# Patient Record
Sex: Male | Born: 1980 | Race: Black or African American | Hispanic: No | Marital: Married | Smoking: Never smoker
Health system: Southern US, Community
[De-identification: ages and names within clinical notes are randomized; demographics above are authoritative.]

## PROBLEM LIST (undated history)

## (undated) DIAGNOSIS — J45909 Unspecified asthma, uncomplicated: Secondary | ICD-10-CM

---

## 2009-05-01 ENCOUNTER — Emergency Department (HOSPITAL_COMMUNITY): Admission: EM | Admit: 2009-05-01 | Discharge: 2009-05-02 | Payer: Self-pay | Admitting: Emergency Medicine

## 2011-03-17 IMAGING — CR DG CHEST 2V
2 series · 2 of 2 positions shown · non-contrast
Comparison: None.

CLINICAL DATA: 28-year-old male right upper chest pain.

CHEST - 2 VIEW

[w chest pa]
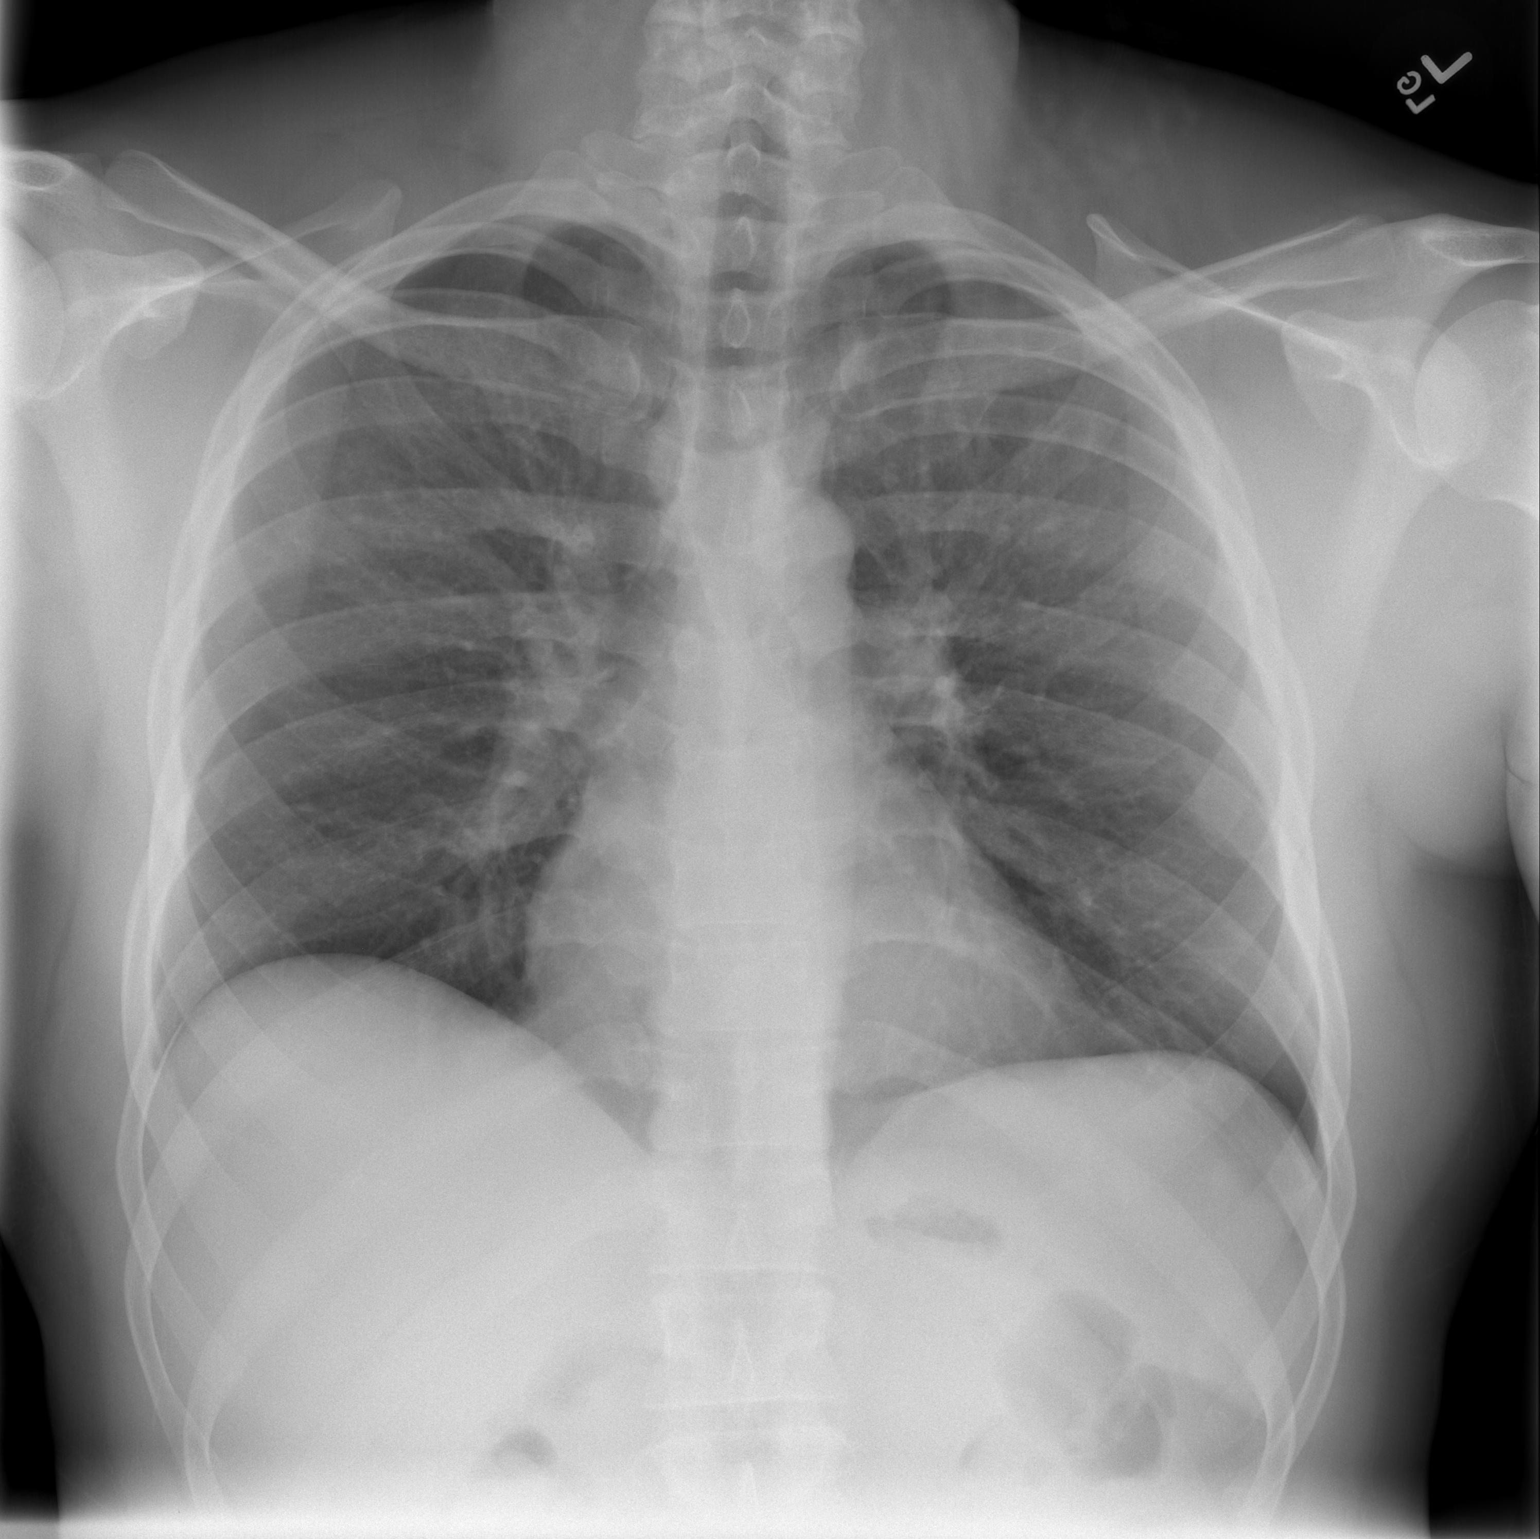

[w chest lat]
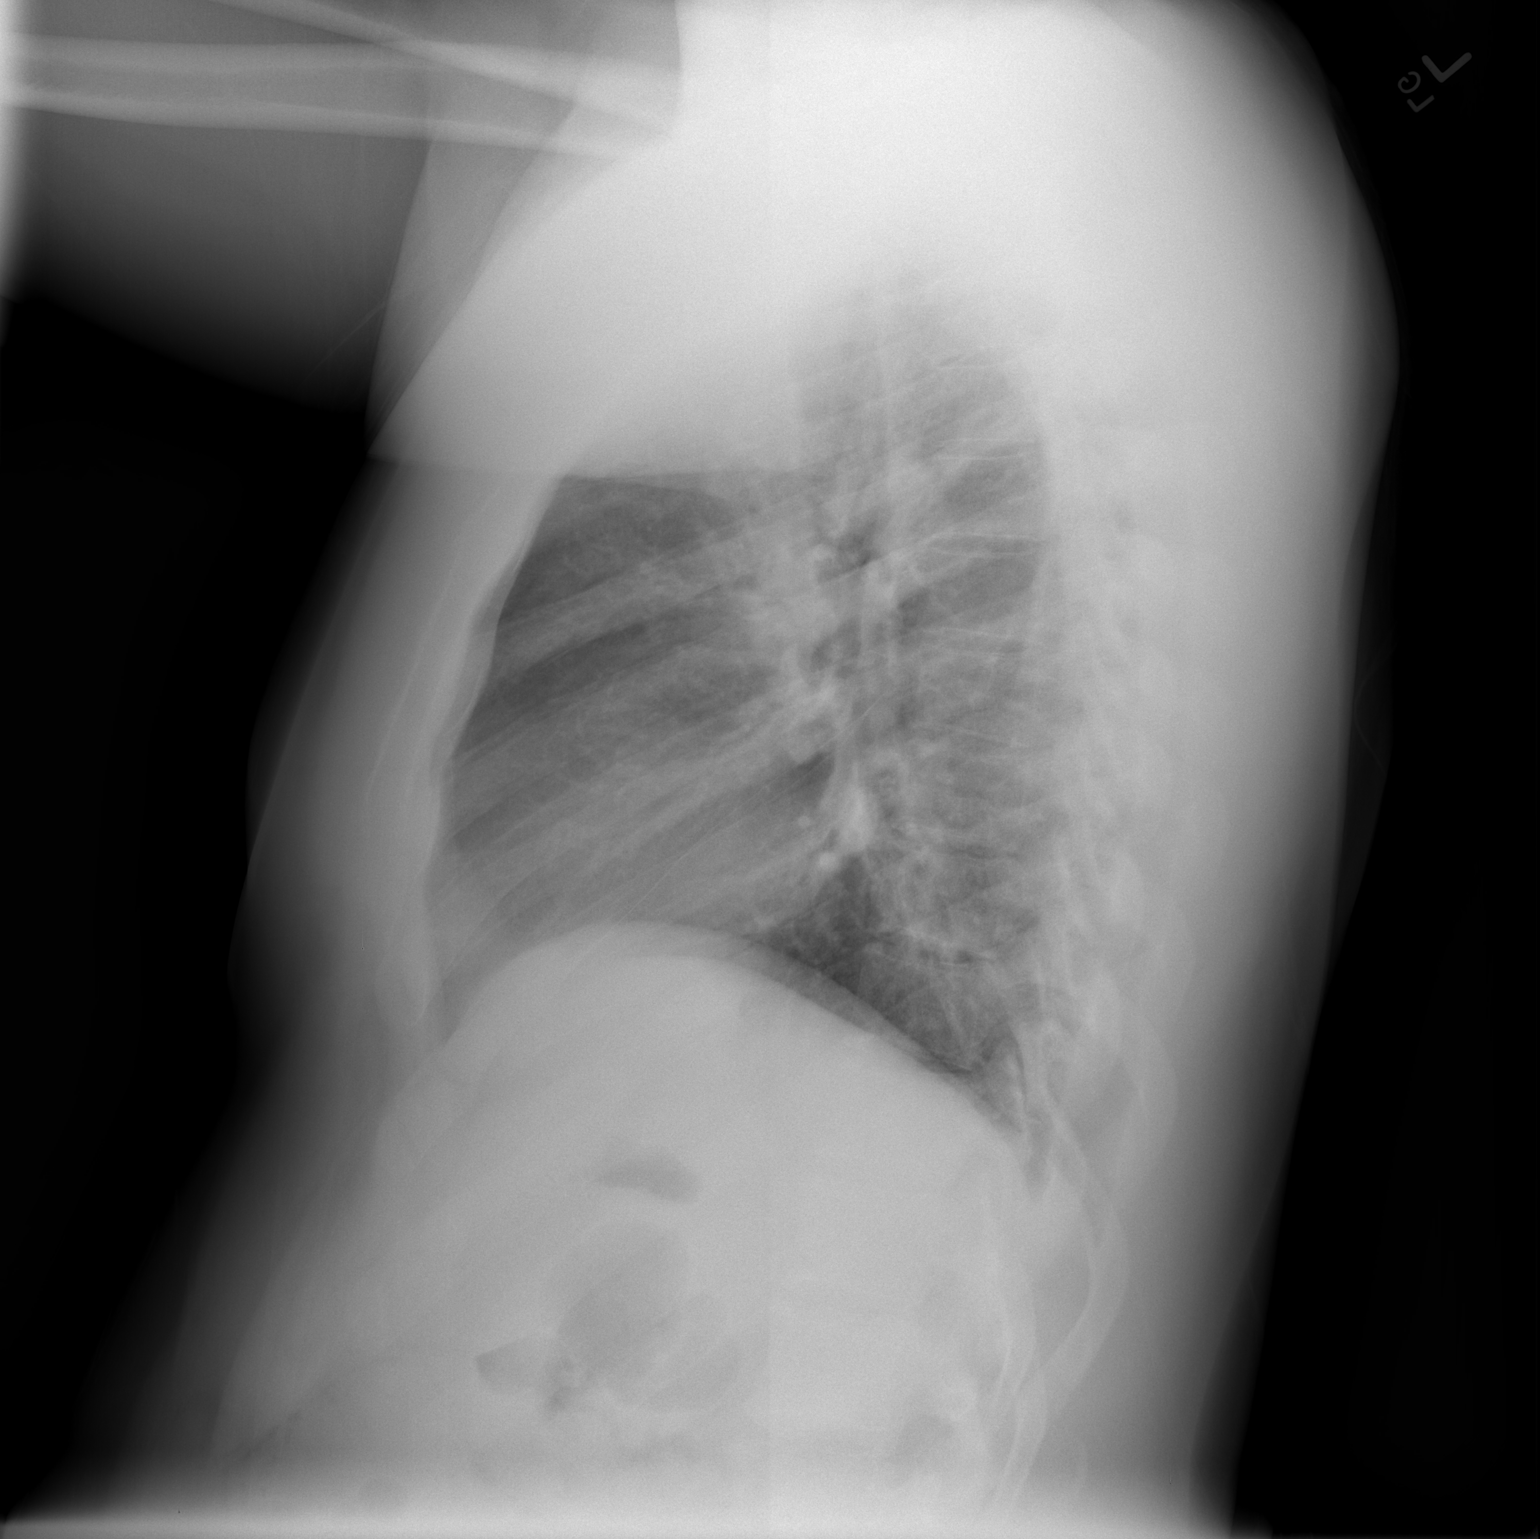

[2 of 2 positions shown; findings below may reference images not displayed]

FINDINGS: Low lung volumes.  Cardiac size and mediastinal contours
are within normal limits.  Visualized tracheal air column is within
normal limits.  Crowding lung markings suspected diffusely.  No
pneumothorax, pulmonary edema, pleural effusion, consolidation, or
confluent airspace opacity. No acute osseous abnormality
identified.
IMPRESSION: Low lung volumes, otherwise no acute cardiopulmonary abnormality.

## 2011-10-29 ENCOUNTER — Encounter (HOSPITAL_COMMUNITY): Payer: Self-pay

## 2011-10-29 ENCOUNTER — Emergency Department (INDEPENDENT_AMBULATORY_CARE_PROVIDER_SITE_OTHER)
Admission: EM | Admit: 2011-10-29 | Discharge: 2011-10-29 | Disposition: A | Discharge status: Home / Self Care | Payer: Self-pay | Source: Home / Self Care | Attending: Family Medicine | Admitting: Family Medicine

## 2011-10-29 DIAGNOSIS — B86 Scabies: Secondary | ICD-10-CM

## 2011-10-29 DIAGNOSIS — Z2089 Contact with and (suspected) exposure to other communicable diseases: Secondary | ICD-10-CM

## 2011-10-29 MED ORDER — PERMETHRIN 5 % EX CREA: TOPICAL_CREAM | CUTANEOUS | Status: DC

## 2011-10-29 MED ORDER — PERMETHRIN 5 % EX CREA: TOPICAL_CREAM | CUTANEOUS | Status: AC

## 2011-10-29 NOTE — ED Notes (Signed)
Reported exposure to scabies couple of weeks ago; has been itching for about same length of time , rash for a couple of days

## 2011-10-29 NOTE — ED Provider Notes (Signed)
History     CSN: [id number]  Arrival date & time 10/29/11  1309   First MD Initiated Contact with Patient 10/29/11 1407      Chief Complaint  Patient presents with  . Rash    (Consider location/radiation/quality/duration/timing/severity/associated sxs/prior treatment) HPI Comments: Marc Howard presents for evaluation of evaluation of itching, over his arms, back, and abdomen. He reports that his godchildren, stayed with them 2 weeks ago, were diagnosed with scabies. His significant other with whom he lives does not report any symptoms.  Patient is a 31 y.o. male presenting with rash. The history is provided by the patient.  Rash  This is a new problem. The current episode started more than 1 week ago. The problem has not changed since onset.The problem is associated with an unknown factor. There has been no fever. The rash is present on the abdomen, back, left arm and right arm. The patient is experiencing no pain. The pain has been constant since onset. Associated symptoms include itching. He has tried nothing for the symptoms.    History reviewed. No pertinent past medical history.  History reviewed. No pertinent past surgical history.  History reviewed. No pertinent family history.  History  Substance Use Topics  . Smoking status: Never Smoker   . Smokeless tobacco: Not on file  . Alcohol Use: No      Review of Systems  Constitutional: Negative.   HENT: Negative.   Eyes: Negative.   Respiratory: Negative.   Cardiovascular: Negative.   Gastrointestinal: Negative.   Genitourinary: Negative.   Musculoskeletal: Negative.   Skin: Positive for itching and rash.  Neurological: Negative.     Allergies  Review of patient's allergies indicates no known allergies.  Home Medications   Current Outpatient Rx  Name Route Sig Dispense Refill  . PERMETHRIN 5 % EX CREA  Apply to affected area once; leave on for at least 8 - 14 hours before washing off; may repeat after 1 week 60 g  1    BP 114/66  Pulse 65  Temp(Src) 98.9 F (37.2 C) (Oral)  Resp 14  SpO2 96%  Physical Exam  Nursing note and vitals reviewed. Constitutional: He is oriented to person, place, and time. He appears well-developed and well-nourished.  HENT:  Head: Normocephalic and atraumatic.  Eyes: EOM are normal.  Neck: Normal range of motion.  Pulmonary/Chest: Effort normal.  Musculoskeletal: Normal range of motion.  Neurological: He is alert and oriented to person, place, and time.  Skin: Skin is warm and dry.       Several small erythematous papular lesions over upper back, abdomen (periumbilical area), forearms  Psychiatric: His behavior is normal.    ED Course  Procedures (including critical care time)  Labs Reviewed - No data to display No results found.   1. Scabies   2. Scabies exposure       MDM  rx given for permethrin cream; return if sx do not improve        Renaee Munda, MD 10/29/11 [phone]

## 2011-10-29 NOTE — Discharge Instructions (Signed)
Apply cream as directed. Wash all clothing in highest temperature setting on washer. Spray all porous (cloth) items with Nix or Rid-X. Shampoo all carpets. If symptoms persist, return to care and check mattress.

## 2023-02-24 ENCOUNTER — Other Ambulatory Visit: Payer: Self-pay

## 2023-02-24 ENCOUNTER — Emergency Department (HOSPITAL_COMMUNITY)
Admission: EM | Admit: 2023-02-24 | Discharge: 2023-02-24 | Disposition: A | Discharge status: Home / Self Care | Payer: BLUE CROSS/BLUE SHIELD | Attending: Emergency Medicine | Admitting: Emergency Medicine

## 2023-02-24 ENCOUNTER — Emergency Department (HOSPITAL_COMMUNITY): Payer: BLUE CROSS/BLUE SHIELD

## 2023-02-24 ENCOUNTER — Encounter (HOSPITAL_COMMUNITY): Payer: Self-pay

## 2023-02-24 DIAGNOSIS — M545 Low back pain, unspecified: Secondary | ICD-10-CM | POA: Diagnosis not present

## 2023-02-24 DIAGNOSIS — M25512 Pain in left shoulder: Secondary | ICD-10-CM | POA: Diagnosis not present

## 2023-02-24 DIAGNOSIS — Y92410 Unspecified street and highway as the place of occurrence of the external cause: Secondary | ICD-10-CM | POA: Insufficient documentation

## 2023-02-24 HISTORY — DX: Unspecified asthma, uncomplicated: J45.909

## 2023-02-24 MED ORDER — ACETAMINOPHEN 500 MG PO TABS
1000.0000 mg | ORAL_TABLET | Freq: Once | ORAL | Status: AC
  Administered 2023-02-24: 1000 mg via ORAL
  Filled 2023-02-24: qty 2

## 2023-02-24 MED ORDER — CYCLOBENZAPRINE HCL 10 MG PO TABS: 10.0000 mg | ORAL_TABLET | Freq: Three times a day (TID) | ORAL | 0 refills | Status: AC

## 2023-02-24 MED ORDER — NAPROXEN 375 MG PO TABS: 375.0000 mg | ORAL_TABLET | Freq: Two times a day (BID) | ORAL | 0 refills | Status: AC

## 2023-02-24 MED ORDER — DIAZEPAM 5 MG PO TABS
5.0000 mg | ORAL_TABLET | Freq: Once | ORAL | Status: AC
  Administered 2023-02-24: 5 mg via ORAL
  Filled 2023-02-24: qty 1
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
## 2023-02-24 NOTE — Discharge Instructions (Signed)
I have prescribed muscle relaxers for your pain, please do not drink or drive while taking this medications as it can make you drowsy.   I have also prescribed anti inflammatories, please take 1 tablet twice a day with food for the next 7 days.    Please follow-up with PCP in 1 week for reevaluation of your symptoms.If you experience any bowel or bladder incontinence, fever, worsening in your symptoms please return to the ED.
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
## 2024-02-21 ENCOUNTER — Emergency Department (HOSPITAL_COMMUNITY)
Admission: EM | Admit: 2024-02-21 | Discharge: 2024-02-22 | Disposition: A | Discharge status: Home / Self Care | Payer: Self-pay | Attending: Emergency Medicine | Admitting: Emergency Medicine

## 2024-02-21 ENCOUNTER — Other Ambulatory Visit: Payer: Self-pay

## 2024-02-21 DIAGNOSIS — M79672 Pain in left foot: Secondary | ICD-10-CM | POA: Insufficient documentation

## 2024-02-21 DIAGNOSIS — L299 Pruritus, unspecified: Secondary | ICD-10-CM | POA: Insufficient documentation

## 2024-02-21 DIAGNOSIS — M7989 Other specified soft tissue disorders: Secondary | ICD-10-CM

## 2024-02-21 DIAGNOSIS — M25572 Pain in left ankle and joints of left foot: Secondary | ICD-10-CM

## 2024-02-21 DIAGNOSIS — M79671 Pain in right foot: Secondary | ICD-10-CM | POA: Insufficient documentation

## 2024-02-21 DIAGNOSIS — M25541 Pain in joints of right hand: Secondary | ICD-10-CM | POA: Insufficient documentation

## 2024-02-21 DIAGNOSIS — M25542 Pain in joints of left hand: Secondary | ICD-10-CM | POA: Insufficient documentation

## 2024-02-21 NOTE — ED Triage Notes (Signed)
 Pt has c/o pain, itching in his feet and hands. Pt states he had a tick bite to the left leg a month ago, then had a rash on bilateral arms. Now he is having this pain with chills and a headache.

## 2024-02-21 NOTE — ED Provider Notes (Signed)
 [place] EM[facility]ENC[facility] DEPARTMENT AT [facility] Provider Note   CSN: [id number] Arrival date & time: 02/21/24  2217     Patient presents with: Joint Pain and Chills   Marc Howard is a 43 y.o. male reportedly otherwise healthy presents to the emergency department today for evaluation of bilateral hand and bilateral feet itching, increase in erythema, and joint pain to these areas for the past few days.  He reports on June 19 he was going fishing and had to walk through the woods and thought that he had poison ivy to his bilateral forearms.  He did use a poison ivy wash and quickly resolved.  He reports that during that time he did have a tick bite on the lower leg that he was able to remove.  It was removed shortly after he was in the woods.  He reports he has been having joint pain and swelling to his bilateral hands and bilateral feet.  Started noticing that they were red shortly prior to arrival.  Reports they have been significantly itchy as well and has not had much relief from Benadryl.  Started having some chills yesterday but no recorded fever.  Questioning is having some hives to his forearms yesterday as well however he reports they resolved.  He denies any other outdoor activity or any additional possible allergen.  No new clothing, socks, shoes, foods, detergents, lotions, soaps, etc.  He denies any other chest pain, shortness of breath.  Patient does consume alcohol daily, around 1 beer and occasionally will have a shot or 2 of liquor.  Marijuana otherwise no other illicit drug use.  HPI     Prior to Admission medications   Not on File    Allergies: Patient has no known allergies.    Review of Systems  Constitutional:  Positive for chills. Negative for fever.  Musculoskeletal:  Positive for arthralgias and joint swelling.  Skin:  Positive for color change.    Updated Vital Signs BP (!) 150/85   Pulse 88   Temp 99.2 F (37.3 C)   Resp 18   SpO2 98%    Physical Exam Vitals and nursing note reviewed.  Constitutional:      General: He is not in acute distress.    Appearance: He is not ill-appearing or toxic-appearing.  Eyes:     General: No scleral icterus. Cardiovascular:     Rate and Rhythm: Normal rate.  Pulmonary:     Effort: Pulmonary effort is normal. No respiratory distress.  Musculoskeletal:     Cervical back: Normal range of motion. No rigidity.  Skin:    General: Skin is warm and dry.     Findings: Erythema present.     Comments: Increase in erythema and warmth to the bilateral hands and feet.  No crepitus.  No red streaking.  No overlying fluctuance induration.  I do not see any petechiae or purpura or any vesicles or blistering.  Compartments are soft.  Nontender to palpation.  Some minimal swelling noted to the bilateral hands and bilateral feet.  Not seen any other rash or skin change noted to the lower legs, chest, abdomen, or back.  Overlying tattoos present on the arm but only increase in warmth and swelling to the bilateral hands.  Neurological:     Mental Status: He is alert.               (all labs ordered are listed, but only abnormal results are displayed) Labs Reviewed  COMPREHENSIVE METABOLIC PANEL WITH GFR  CBC WITH DIFFERENTIAL/PLATELET  LYME DISEASE SEROLOGY W/REFLEX  SPOTTED FEVER GROUP ANTIBODIES  ROCKY MTN SPOTTED FVR ABS PNL(IGG+IGM)  LIPASE, BLOOD    EKG: None  Radiology: No results found.  Procedures   Medications Ordered in the ED - No data to display   Medical Decision Making Amount and/or Complexity of Data Reviewed Labs: ordered.   43 y.o. male presents to the ER for evaluation of bilateral erythema and increase in warmth to bilateral hands and feet with itching. Differential diagnosis includes but is not limited to SJS, TENS, contact dermatitic, allergic dermatitis, urticaria, shingles, RMSF, necrotizing fascitis, cholestasis. Vital signs mildly elevated blood  pressure otherwise unremarkable.  Patient afebrile. Physical exam as noted above.   Discussed this case with my attending and appropriate labs ordered.  No shockable like rash however does have increase in erythema and warmth.  Palpable pulses.  Sensation intact.  No blistering or vesicles present.  No red streaking noted.  No induration or fluctuance or crepitus palpated.  Question possible allergic reaction.  Patient had a minor headache before arrival but no neck pain or stiffness.  No nuchal rigidity.  Doubt any meningitis.  Labs are pending.   12:00 AM Care of LOMAN LOGAN  transferred to [facility] at the end of my shift as the patient will require reassessment once labs/imaging have resulted. Patient presentation, ED course, and plan of care discussed with review of all pertinent labs and imaging. Please see his/her note for further details regarding further ED course and disposition. Plan at time of handoff is follow up on labs. If grossly unremarkable, would give doxycycline  and medrol  dose pack with derm referral. This may be altered or completely changed at the discretion of the oncoming team pending results of further workup.  I discussed this case with my attending physician who cosigned this note including patient's presenting symptoms, physical exam, and planned diagnostics and interventions. Attending physician stated agreement with plan or made changes to plan which were implemented.   Portions of this report may have been transcribed using voice recognition software. Every effort was made to ensure accuracy; however, inadvertent computerized transcription errors may be present.    Final diagnoses:  None    ED Discharge Orders     None          Bernis Ernst, [place] 02/22/24 0014    Bernard Franky, MD 02/22/24 [phone]

## 2024-02-22 ENCOUNTER — Emergency Department (HOSPITAL_COMMUNITY): Payer: Self-pay

## 2024-02-22 LAB — CBC WITH DIFFERENTIAL/PLATELET
Abs Immature Granulocytes: 0.02 K/uL (ref 0.00–0.07)
Basophils Absolute: 0 K/uL (ref 0.0–0.1)
Basophils Relative: 0 %
Eosinophils Absolute: 0.1 K/uL (ref 0.0–0.5)
Eosinophils Relative: 1 %
HCT: 38.6 % — ABNORMAL LOW (ref 39.0–52.0)
Hemoglobin: 13.3 g/dL (ref 13.0–17.0)
Immature Granulocytes: 0 %
Lymphocytes Relative: 21 %
Lymphs Abs: 1.7 K/uL (ref 0.7–4.0)
MCH: 30 pg (ref 26.0–34.0)
MCHC: 34.5 g/dL (ref 30.0–36.0)
MCV: 86.9 fL (ref 80.0–100.0)
Monocytes Absolute: 0.7 K/uL (ref 0.1–1.0)
Monocytes Relative: 9 %
Neutro Abs: 5.6 K/uL (ref 1.7–7.7)
Neutrophils Relative %: 69 %
Platelets: 277 K/uL (ref 150–400)
RBC: 4.44 MIL/uL (ref 4.22–5.81)
RDW: 12 % (ref 11.5–15.5)
WBC: 8.2 K/uL (ref 4.0–10.5)
nRBC: 0 % (ref 0.0–0.2)

## 2024-02-22 LAB — COMPREHENSIVE METABOLIC PANEL WITH GFR
ALT: 19 U/L (ref 0–44)
AST: 26 U/L (ref 15–41)
Albumin: 3.8 g/dL (ref 3.5–5.0)
Alkaline Phosphatase: 53 U/L (ref 38–126)
Anion gap: 11 (ref 5–15)
BUN: 19 mg/dL (ref 6–20)
CO2: 24 mmol/L (ref 22–32)
Calcium: 8.7 mg/dL — ABNORMAL LOW (ref 8.9–10.3)
Chloride: 103 mmol/L (ref 98–111)
Creatinine, Ser: 1.32 mg/dL — ABNORMAL HIGH (ref 0.61–1.24)
GFR, Estimated: >60 mL/min (ref >60)
Glucose, Bld: 105 mg/dL — ABNORMAL HIGH (ref 70–99)
Potassium: 3.5 mmol/L (ref 3.5–5.1)
Sodium: 138 mmol/L (ref 135–145)
Total Bilirubin: 2.4 mg/dL — ABNORMAL HIGH (ref 0.0–1.2)
Total Protein: 7 g/dL (ref 6.5–8.1)

## 2024-02-22 LAB — BILIRUBIN, DIRECT: Bilirubin, Direct: 0.4 mg/dL — ABNORMAL HIGH (ref 0.0–0.2)

## 2024-02-22 LAB — RESP PANEL BY RT-PCR (RSV, FLU A&B, COVID)  RVPGX2
Influenza A by PCR: NEGATIVE
Influenza B by PCR: NEGATIVE
Resp Syncytial Virus by PCR: NEGATIVE
SARS Coronavirus 2 by RT PCR: NEGATIVE

## 2024-02-22 LAB — LIPASE, BLOOD: Lipase: 35 U/L (ref 11–51)

## 2024-02-22 LAB — RPR: RPR Ser Ql: NONREACTIVE

## 2024-02-22 MED ORDER — ACETAMINOPHEN 500 MG PO TABS
1000.0000 mg | ORAL_TABLET | Freq: Once | ORAL | Status: AC
Start: 2024-02-22 — End: 2024-02-22
  Administered 2024-02-22: 1000 mg via ORAL
  Filled 2024-02-22: qty 2

## 2024-02-22 MED ORDER — DOXYCYCLINE HYCLATE 100 MG PO CAPS: 100.0000 mg | ORAL_CAPSULE | Freq: Two times a day (BID) | ORAL | 0 refills | Status: AC

## 2024-02-22 MED ORDER — METHYLPREDNISOLONE 4 MG PO TBPK: ORAL_TABLET | ORAL | 0 refills | Status: DC

## 2024-02-22 NOTE — ED Notes (Signed)
 Ultrasound at bedside

## 2024-02-22 NOTE — ED Notes (Addendum)
 Called lab to add bilirubin to previous labs sent

## 2024-02-22 NOTE — ED Provider Notes (Signed)
  Physical Exam  BP 136/80 (BP Location: Right Arm)   Pulse 93   Temp 99.2 F (37.3 C)   Resp 18   SpO2 95%   Physical Exam  Procedures  Procedures  ED Course / MDM   Clinical Course as of 02/22/24 0244  Sun Feb 21, 2024  2357 Ensure no cholestasis, doxy 100mg  bid 10 days, medrol  dose pak, refer to derm [CG]    Clinical Course User Index [CG] Ruthell Lonni FALCON, PA-C   Medical Decision Making Amount and/or Complexity of Data Reviewed Labs: ordered.  Risk OTC drugs.   Patient signed out to me at shift change pending laboratory results, reevaluation.  Please see the previous provider note for further details.  In short, 43 year old male with bilateral hand, bilateral feet itching, increase in erythema and joint pain for the past few days.  Apparently was exposed to poison ivy on June 19.  States that at this time he also noticed a tick bite to his lower leg which he removed.  Patient signed out to me pending laboratory results.  Patient labs have resulted.  CBC without leukocytosis or anemia.  Lipase unremarkable.  Viral panel negative for all.  CMP with creatinine 1.32, GFR 11.  Bilirubin 2.4.  Cholestasis could certainly be because of increased pruritus.  Have added on direct bilirubin, will assess further with RUQ ultrasound.  Update: Direct bilirubin 0.4.  Ultrasound shows contracted gallbladder but no evidence of cholecystitis.  Patient has no right upper quadrant tenderness.  At this time, will place patient on doxycycline , Medrol  Dosepak.  Will have patient referred to dermatology as previous provider planned.  Patient was given return precautions and he voiced understanding.  He is stable to discharge.         Ruthell Lonni FALCON, PA-C 02/22/24 0244    Jerral Meth, MD 02/22/24 [phone]

## 2024-02-22 NOTE — ED Provider Notes (Incomplete)
  [place] EMERGENCY DEPARTMENT AT [facility] Provider Note   CSN: [id number] Arrival date & time: 02/21/24  2217     Patient presents with: Joint Pain and Chills   Marc Howard is a 43 y.o. male reportedly otherwise healthy presents to the emergency department today for evaluation of bilateral hand and bilateral feet itching, increase in erythema, and joint pain to these areas  HPI     Prior to Admission medications   Not on File    Allergies: Patient has no known allergies.    Review of Systems  Updated Vital Signs BP (!) 150/85   Pulse 88   Temp 99.2 F (37.3 C)   Resp 18   SpO2 98%   Physical Exam  (all labs ordered are listed, but only abnormal results are displayed) Labs Reviewed  COMPREHENSIVE METABOLIC PANEL WITH GFR  CBC WITH DIFFERENTIAL/PLATELET  LYME DISEASE SEROLOGY W/REFLEX  SPOTTED FEVER GROUP ANTIBODIES  ROCKY MTN SPOTTED FVR ABS PNL(IGG+IGM)  LIPASE, BLOOD    EKG: None  Radiology: No results found.  {Document cardiac monitor, telemetry assessment procedure when appropriate:32947} Procedures   Medications Ordered in the ED - No data to display    {Click here for ABCD2, HEART and other calculators REFRESH Note before signing:1}                              Medical Decision Making Amount and/or Complexity of Data Reviewed Labs: ordered.   43 y.o. male presents to the ER for evaluation of bilateral erythema and increase in warmth to bilateral hands and feet with itching. Differential diagnosis includes but is not limited to SJS, TENS, contact dermatitic, allergic dermatitis, urticaria, shingles, RMSF, necrotizing fascitis, cholestasis. Vital signs mildly elevated blood pressure otherwise unremarkable.  Patient afebrile. Physical exam as noted above.   Labs are pending.   12:00 AM Care of Marc Howard  transferred to [facility] *** and Dr. PIERRETTE at the end of my shift as the patient will require reassessment once labs/imaging have  resulted. Patient presentation, ED course, and plan of care discussed with review of all pertinent labs and imaging. Please see his/her note for further details regarding further ED course and disposition. Plan at time of handoff is ***. This may be altered or completely changed at the discretion of the oncoming team pending results of further workup.   I discussed this case with my attending physician who cosigned this note including patient's presenting symptoms, physical exam, and planned diagnostics and interventions. Attending physician stated agreement with plan or made changes to plan which were implemented.   Portions of this report may have been transcribed using voice recognition software. Every effort was made to ensure accuracy; however, inadvertent computerized transcription errors may be present.    Final diagnoses:  None    ED Discharge Orders     None

## 2024-02-22 NOTE — Discharge Instructions (Addendum)
 It was a pleasure taking part in your care.  As discussed, I am referring her to dermatology.  I am also placing on antibiotics called doxycycline .  Please take this twice a day for the next 10 days.  Please take Medrol  Dosepak as prescribed.  Please follow-up with dermatology by calling number attached to this form.  Please return to the ED with any new or worsening symptoms.  For itching, please take Zyrtec once a day.

## 2024-02-23 LAB — LYME DISEASE SEROLOGY W/REFLEX: Lyme Total Antibody EIA: NEGATIVE

## 2024-02-24 LAB — SPOTTED FEVER GROUP ANTIBODIES
Spotted Fever Group IgG: <1:64 {titer}
Spotted Fever Group IgM: <1:64 {titer}

## 2024-03-19 ENCOUNTER — Emergency Department (HOSPITAL_COMMUNITY): Payer: Self-pay

## 2024-03-19 ENCOUNTER — Encounter (HOSPITAL_COMMUNITY): Payer: Self-pay | Admitting: Emergency Medicine

## 2024-03-19 ENCOUNTER — Other Ambulatory Visit: Payer: Self-pay

## 2024-03-19 ENCOUNTER — Emergency Department (HOSPITAL_COMMUNITY)
Admission: EM | Admit: 2024-03-19 | Discharge: 2024-03-20 | Disposition: A | Discharge status: Home / Self Care | Payer: Self-pay | Attending: Emergency Medicine | Admitting: Emergency Medicine

## 2024-03-19 DIAGNOSIS — T7840XA Allergy, unspecified, initial encounter: Secondary | ICD-10-CM | POA: Insufficient documentation

## 2024-03-19 DIAGNOSIS — I10 Essential (primary) hypertension: Secondary | ICD-10-CM | POA: Insufficient documentation

## 2024-03-19 LAB — CBC WITH DIFFERENTIAL/PLATELET
Abs Immature Granulocytes: 0.02 K/uL (ref 0.00–0.07)
Basophils Absolute: 0 K/uL (ref 0.0–0.1)
Basophils Relative: 0 %
Eosinophils Absolute: 0.1 K/uL (ref 0.0–0.5)
Eosinophils Relative: 2 %
HCT: 43.1 % (ref 39.0–52.0)
Hemoglobin: 14.3 g/dL (ref 13.0–17.0)
Immature Granulocytes: 0 %
Lymphocytes Relative: 28 %
Lymphs Abs: 2.1 K/uL (ref 0.7–4.0)
MCH: 29.3 pg (ref 26.0–34.0)
MCHC: 33.2 g/dL (ref 30.0–36.0)
MCV: 88.3 fL (ref 80.0–100.0)
Monocytes Absolute: 0.8 K/uL (ref 0.1–1.0)
Monocytes Relative: 10 %
Neutro Abs: 4.5 K/uL (ref 1.7–7.7)
Neutrophils Relative %: 60 %
Platelets: 296 K/uL (ref 150–400)
RBC: 4.88 MIL/uL (ref 4.22–5.81)
RDW: 12.7 % (ref 11.5–15.5)
WBC: 7.6 K/uL (ref 4.0–10.5)
nRBC: 0 % (ref 0.0–0.2)

## 2024-03-19 LAB — COMPREHENSIVE METABOLIC PANEL WITH GFR
ALT: 15 U/L (ref 0–44)
AST: 20 U/L (ref 15–41)
Albumin: 4 g/dL (ref 3.5–5.0)
Alkaline Phosphatase: 61 U/L (ref 38–126)
Anion gap: 10 (ref 5–15)
BUN: 16 mg/dL (ref 6–20)
CO2: 24 mmol/L (ref 22–32)
Calcium: 8.7 mg/dL — ABNORMAL LOW (ref 8.9–10.3)
Chloride: 104 mmol/L (ref 98–111)
Creatinine, Ser: 1.11 mg/dL (ref 0.61–1.24)
GFR, Estimated: >60 mL/min (ref >60)
Glucose, Bld: 90 mg/dL (ref 70–99)
Potassium: 3.8 mmol/L (ref 3.5–5.1)
Sodium: 138 mmol/L (ref 135–145)
Total Bilirubin: 1.6 mg/dL — ABNORMAL HIGH (ref 0.0–1.2)
Total Protein: 7.5 g/dL (ref 6.5–8.1)

## 2024-03-19 MED ORDER — CEFAZOLIN SODIUM-DEXTROSE 2-4 GM/100ML-% IV SOLN
2.0000 g | Freq: Once | INTRAVENOUS | Status: AC
  Administered 2024-03-20: 2 g via INTRAVENOUS
  Filled 2024-03-19: qty 100

## 2024-03-19 NOTE — ED Triage Notes (Signed)
 Pt presents to the ED via POV with complaints of  L hand swelling and itching that started yesterday and has gotten progressively worse. Edema noted to the L hand with a mild rash on the back of his hand. He notes taking 2 Benadryl  this AM without improvement. A&Ox4 at this time. Denies CP or SOB.

## 2024-03-19 NOTE — ED Provider Notes (Addendum)
 [name] Provider Note   CSN: [id number] Arrival date & time: 03/19/24  1958     Patient presents with: Hand Swelling and Rash   Marc Howard is a 43 y.o. male who presents with 24 hours of progressively worsening left hand pain swelling redness and warmth now progressing up to the wrist.  Rash on the back of the hand as well.  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  No fevers or chills. Occasional mild itching. Patient states he believed it was an allergic reaction.   Similar presentation per patient ~ 1 month ago, however was bilateral hands at that time and per chart review, presentation was far more mild. Was treated with medrol  dose-pak and doxycycline .   HPI     Prior to Admission medications   Medication Sig Start Date End Date Taking? Authorizing Provider  famotidine  (PEPCID ) 20 MG tablet Take 1 tablet (20 mg total) by mouth daily for 7 days. 03/20/24 03/27/24 Yes Khamila Bassinger, Pleasant SAUNDERS, PA-C  predniSONE  (STERAPRED UNI-PAK 21 TAB) 10 MG (21) TBPK tablet Take by mouth daily. Take 6 tabs by mouth daily  for 1 days, then 5 tabs for 1 days, then 4 tabs for 1 days, then 3 tabs for 1 days, 2 tabs for 1 days, then 1 tab by mouth daily for 2 days 03/20/24  Yes Keana Dueitt R, PA-C  doxycycline  (VIBRAMYCIN ) 100 MG capsule Take 1 capsule (100 mg total) by mouth 2 (two) times daily. 02/22/24   Ruthell Lonni FALCON, PA-C    Allergies: Patient has no known allergies.    Review of Systems  Genitourinary:  Positive for frequency.  Musculoskeletal:        L hand swelling and pain    Updated Vital Signs BP 119/77   Pulse 73   Temp 98.3 F (36.8 C)   Resp 17   Ht 6' (1.829 m)   Wt 83 kg   SpO2 98%   BMI 24.82 kg/m   Physical Exam Vitals and nursing note reviewed.  Constitutional:      Appearance: He is not ill-appearing or toxic-appearing.  HENT:     Head: Normocephalic and atraumatic.      Mouth/Throat:     Mouth: Mucous membranes are moist.     Pharynx: No oropharyngeal exudate or posterior oropharyngeal erythema.  Eyes:     General:        Right eye: No discharge.        Left eye: No discharge.     Conjunctiva/sclera: Conjunctivae normal.  Cardiovascular:     Rate and Rhythm: Normal rate and regular rhythm.     Pulses: Normal pulses.     Heart sounds: Normal heart sounds. No murmur heard. Pulmonary:     Effort: Pulmonary effort is normal. No respiratory distress.     Breath sounds: Normal breath sounds. No wheezing or rales.  Abdominal:     General: Bowel sounds are normal. There is no distension.     Palpations: Abdomen is soft.     Tenderness: There is no abdominal tenderness.  Musculoskeletal:        General: No deformity.       Hands:     Cervical back: Neck supple.     Right lower leg: No edema.     Left lower leg: No edema.  Skin:    General: Skin is warm and dry.     Capillary  Refill: Capillary refill takes less than 2 seconds.     Findings: Rash present. Rash is urticarial. Rash is not crusting, purpuric or vesicular.  Neurological:     General: No focal deficit present.     Mental Status: He is alert and oriented to person, place, and time. Mental status is at baseline.  Psychiatric:        Mood and Affect: Mood normal.        (all labs ordered are listed, but only abnormal results are displayed) Labs Reviewed  COMPREHENSIVE METABOLIC PANEL WITH GFR - Abnormal; Notable for the following components:      Result Value   Calcium 8.7 (*)    Total Bilirubin 1.6 (*)    All other components within normal limits  CBC WITH DIFFERENTIAL/PLATELET  URINALYSIS, W/ REFLEX TO CULTURE (INFECTION SUSPECTED)    EKG: None  Radiology: DG Hand Complete Left Result Date: 03/19/2024 CLINICAL DATA:  Hand rash and swelling. Left hand swelling and itching. Progressively worsening. EXAM: LEFT HAND - COMPLETE 3+ VIEW COMPARISON:  None Available. FINDINGS: No  acute fracture or dislocation. Remote posttraumatic changes in the fifth metacarpal. Soft tissue swelling about the hand. No evidence of osteomyelitis. IMPRESSION: 1. No acute fracture. 2. Soft tissue swelling about the hand. Electronically Signed   By: Norman Gatlin M.D.   On: 03/19/2024 22:18     Procedures   Medications Ordered in the ED  ceFAZolin  (ANCEF ) IVPB 2g/100 mL premix (0 g Intravenous Stopped 03/20/24 0143)  famotidine  (PEPCID ) IVPB 20 mg premix (0 mg Intravenous Stopped 03/20/24 0228)  methylPREDNISolone  sodium succinate (SOLU-MEDROL ) 125 mg/2 mL injection 125 mg (125 mg Intravenous Given 03/20/24 0135)  diphenhydrAMINE  (BENADRYL ) injection 25 mg (25 mg Intravenous Given 03/20/24 0136)                                    Medical Decision Making 43 y/o male with L hand swelling and pain.   HTN on intake, VS otherwise normal, abdominal exam is benign. See photo of L hand above.   Differential includes but is not limited to cellulitis, gout, allergic rxn, trauma, insect bit, animal bite.  Amount and/or Complexity of Data Reviewed Labs: ordered.    Details: CBC without leukocytosis or anemia, CMP with mild elevation in bilirubin to 1.6 improved from patient's prior at 2.4.  UA unremarkable.   Radiology: ordered.    Details: Plain film of the left hand with soft tissue swelling but otherwise unremarkable.    Risk Prescription drug management.   Patient was administered a dose of antibiotics in the emergency department initially for presumption of infection, however upon further evaluation and laboratory studies clinical picture is now most consistent with allergic reaction.  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  Given this reaction is isolated to the hand and wrist and his prior reaction was to the hands as well, favor allergic reaction to infection at this time.  Additionally thorough skin exam without evidence of  trauma to the hand.  Patient treated for allergic reaction the emerged part with some improvement to the swelling over the dorsum of the left hand and wrist.  Will recommend treatment for allergic reaction continuing outpatient over the next week and close outpatient follow-up as previously discussed.  Return precautions were given.  [place]  voiced understanding of his medical evaluation and  treatment plan. Each of their questions answered to their expressed satisfaction.  Return precautions were given.  Patient is well-appearing, stable, and was discharged in good condition.  This chart was dictated using voice recognition software, Dragon. Despite the best efforts of this provider to proofread and correct errors, errors may still occur which can change documentation meaning.      Final diagnoses:  Allergic reaction, initial encounter    ED Discharge Orders          Ordered    famotidine  (PEPCID ) 20 MG tablet  Daily        03/20/24 0217    predniSONE  (STERAPRED UNI-PAK 21 TAB) 10 MG (21) TBPK tablet  Daily        03/20/24 0219               Tailer Volkert, Pleasant SAUNDERS, PA-C 03/20/24 0248    Rakeen Gaillard, Pleasant SAUNDERS, PA-C 03/20/24 0248    Ruthe Cornet, DO 03/20/24 1553

## 2024-03-20 LAB — URINALYSIS, W/ REFLEX TO CULTURE (INFECTION SUSPECTED)
Bacteria, UA: NONE SEEN
Bilirubin Urine: NEGATIVE
Glucose, UA: NEGATIVE mg/dL
Hgb urine dipstick: NEGATIVE
Ketones, ur: NEGATIVE mg/dL
Leukocytes,Ua: NEGATIVE
Nitrite: NEGATIVE
Protein, ur: NEGATIVE mg/dL
Specific Gravity, Urine: 1.015 (ref 1.005–1.030)
pH: 6 (ref 5.0–8.0)

## 2024-03-20 MED ORDER — PREDNISONE 10 MG (21) PO TBPK: ORAL_TABLET | Freq: Every day | ORAL | 0 refills | Status: AC

## 2024-03-20 MED ORDER — METHYLPREDNISOLONE SODIUM SUCC 125 MG IJ SOLR
125.0000 mg | Freq: Once | INTRAMUSCULAR | Status: AC
  Administered 2024-03-20: 125 mg via INTRAVENOUS
  Filled 2024-03-20: qty 2

## 2024-03-20 MED ORDER — FAMOTIDINE IN NACL 20-0.9 MG/50ML-% IV SOLN
20.0000 mg | Freq: Once | INTRAVENOUS | Status: AC
  Administered 2024-03-20: 20 mg via INTRAVENOUS
  Filled 2024-03-20: qty 50

## 2024-03-20 MED ORDER — FAMOTIDINE 20 MG PO TABS: 20.0000 mg | ORAL_TABLET | Freq: Every day | ORAL | 0 refills | Status: AC

## 2024-03-20 MED ORDER — DIPHENHYDRAMINE HCL 50 MG/ML IJ SOLN
25.0000 mg | Freq: Once | INTRAMUSCULAR | Status: AC
  Administered 2024-03-20: 25 mg via INTRAVENOUS
  Filled 2024-03-20: qty 1

## 2024-03-20 NOTE — Discharge Instructions (Signed)
 You are having an allergic reaction; it is unclear what the trigger was. Please take the prescribed steroid and famotidine  for the entire course. Please also take an over the counter zytrec daily x 1 week. Do not take benadryl  with this daily. Return to the ER with any new severe symptoms.
